# Patient Record
Sex: Female | Born: 1980 | Hispanic: Yes | Marital: Single | Smoking: Never smoker
Health system: Southern US, Community
[De-identification: ages and names within clinical notes are randomized; demographics above are authoritative.]

---

## 2014-09-15 ENCOUNTER — Ambulatory Visit (INDEPENDENT_AMBULATORY_CARE_PROVIDER_SITE_OTHER): Payer: 59 | Admitting: Family Medicine

## 2014-09-15 ENCOUNTER — Encounter: Payer: Self-pay | Admitting: Family Medicine

## 2014-09-15 ENCOUNTER — Encounter (INDEPENDENT_AMBULATORY_CARE_PROVIDER_SITE_OTHER): Payer: Self-pay

## 2014-09-15 ENCOUNTER — Ambulatory Visit (HOSPITAL_BASED_OUTPATIENT_CLINIC_OR_DEPARTMENT_OTHER)
Admission: RE | Admit: 2014-09-15 | Discharge: 2014-09-15 | Disposition: A | Discharge status: Home / Self Care | Payer: 59 | Source: Ambulatory Visit | Attending: Family Medicine | Admitting: Family Medicine

## 2014-09-15 VITALS — BP 120/76 | HR 77 | Ht 64.0 in | Wt 132.0 lb

## 2014-09-15 DIAGNOSIS — M533 Sacrococcygeal disorders, not elsewhere classified: Secondary | ICD-10-CM | POA: Insufficient documentation

## 2014-09-15 DIAGNOSIS — W000XXA Fall on same level due to ice and snow, initial encounter: Secondary | ICD-10-CM | POA: Insufficient documentation

## 2014-09-15 DIAGNOSIS — S300XXA Contusion of lower back and pelvis, initial encounter: Secondary | ICD-10-CM

## 2014-09-15 DIAGNOSIS — Y9323 Activity, snow (alpine) (downhill) skiing, snow boarding, sledding, tobogganing and snow tubing: Secondary | ICD-10-CM | POA: Diagnosis not present

## 2014-09-15 MED ORDER — HYDROCODONE-ACETAMINOPHEN 5-325 MG PO TABS: 1.0000 | ORAL_TABLET | Freq: Four times a day (QID) | ORAL | Status: AC | PRN

## 2014-09-15 NOTE — Patient Instructions (Signed)
You have a coccyx contusion. Treatment is supportive. Tylenol and/or ibuprofen as needed for pain. Icing as needed 15 minutes at a time 3-4 times a day. Consider a donut pad if sitting for a while is painful. Activities as tolerated - no restrictions. Follow up with me as needed.

## 2014-09-16 DIAGNOSIS — S300XXA Contusion of lower back and pelvis, initial encounter: Secondary | ICD-10-CM | POA: Insufficient documentation

## 2014-09-16 NOTE — Assessment & Plan Note (Signed)
radiographs negative.  Reassured patient.  Icing, tylenol/nsaids.  Donut pad if needed.  F/u prn.

## 2014-09-16 NOTE — Progress Notes (Signed)
PCP: No primary care provider on file.  Subjective:   HPI: Patient is a 34 y.o. female here for tailbone injury.  Patient reports on 2/6 while snowboarding she was coming off the lift and fell hard onto her tailbone onto ice. Pain and swelling, bruising initially. Has continued to have pain since then especially when getting up from a seated position. Took ibuprofen initially. Wants to make sure she's not damaging something.  No past medical history on file.  No current outpatient prescriptions on file prior to visit.   No current facility-administered medications on file prior to visit.    No past surgical history on file.  No Known Allergies  History   Social History  . Marital Status: Single    Spouse Name: N/A  . Number of Children: N/A  . Years of Education: N/A   Occupational History  . Not on file.   Social History Main Topics  . Smoking status: Never Smoker   . Smokeless tobacco: Not on file  . Alcohol Use: Not on file  . Drug Use: Not on file  . Sexual Activity: Not on file   Other Topics Concern  . Not on file   Social History Narrative  . No narrative on file    No family history on file.  BP 120/76 mmHg  Pulse 77  Ht 5\' 4"  (1.626 m)  Wt 132 lb (59.875 kg)  BMI 22.65 kg/m2  LMP 09/12/2014  Review of Systems: See HPI above.    Objective:  Physical Exam:  Gen: NAD  Back: No gross deformity, scoliosis. Minimal TTP low sacrum/coccyx.  No other tenderness. FROM without pain. Strength LEs 5/5 all muscle groups.   2+ MSRs in patellar and achilles tendons, equal bilaterally. Negative SLRs. Sensation intact to light touch bilaterally. Negative logroll bilateral hips    Assessment & Plan:  1. Coccyx contusion - radiographs negative.  Reassured patient.  Icing, tylenol/nsaids.  Donut pad if needed.  F/u prn.

## 2015-10-19 IMAGING — DX DG SACRUM/COCCYX 2+V
3 series · 3 of 3 positions shown · non-contrast
Comparison: None.

CLINICAL DATA: Pain following fall while snowboarding

EXAM:
SACRUM AND COCCYX - 2+ VIEW

[sacrum ap]
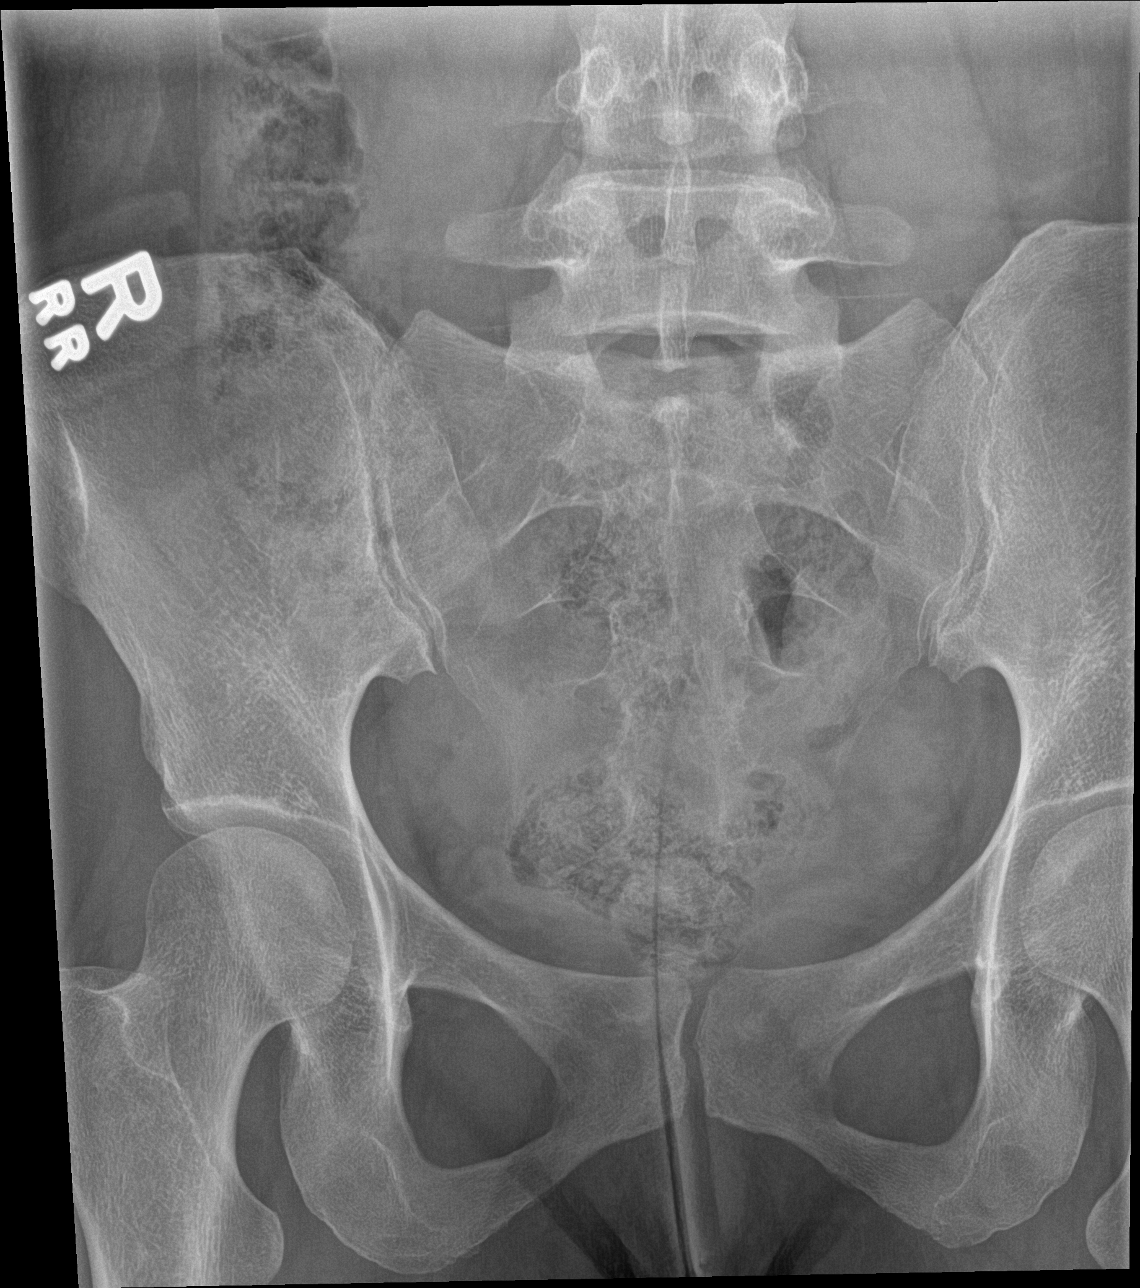

[coccyx ap]
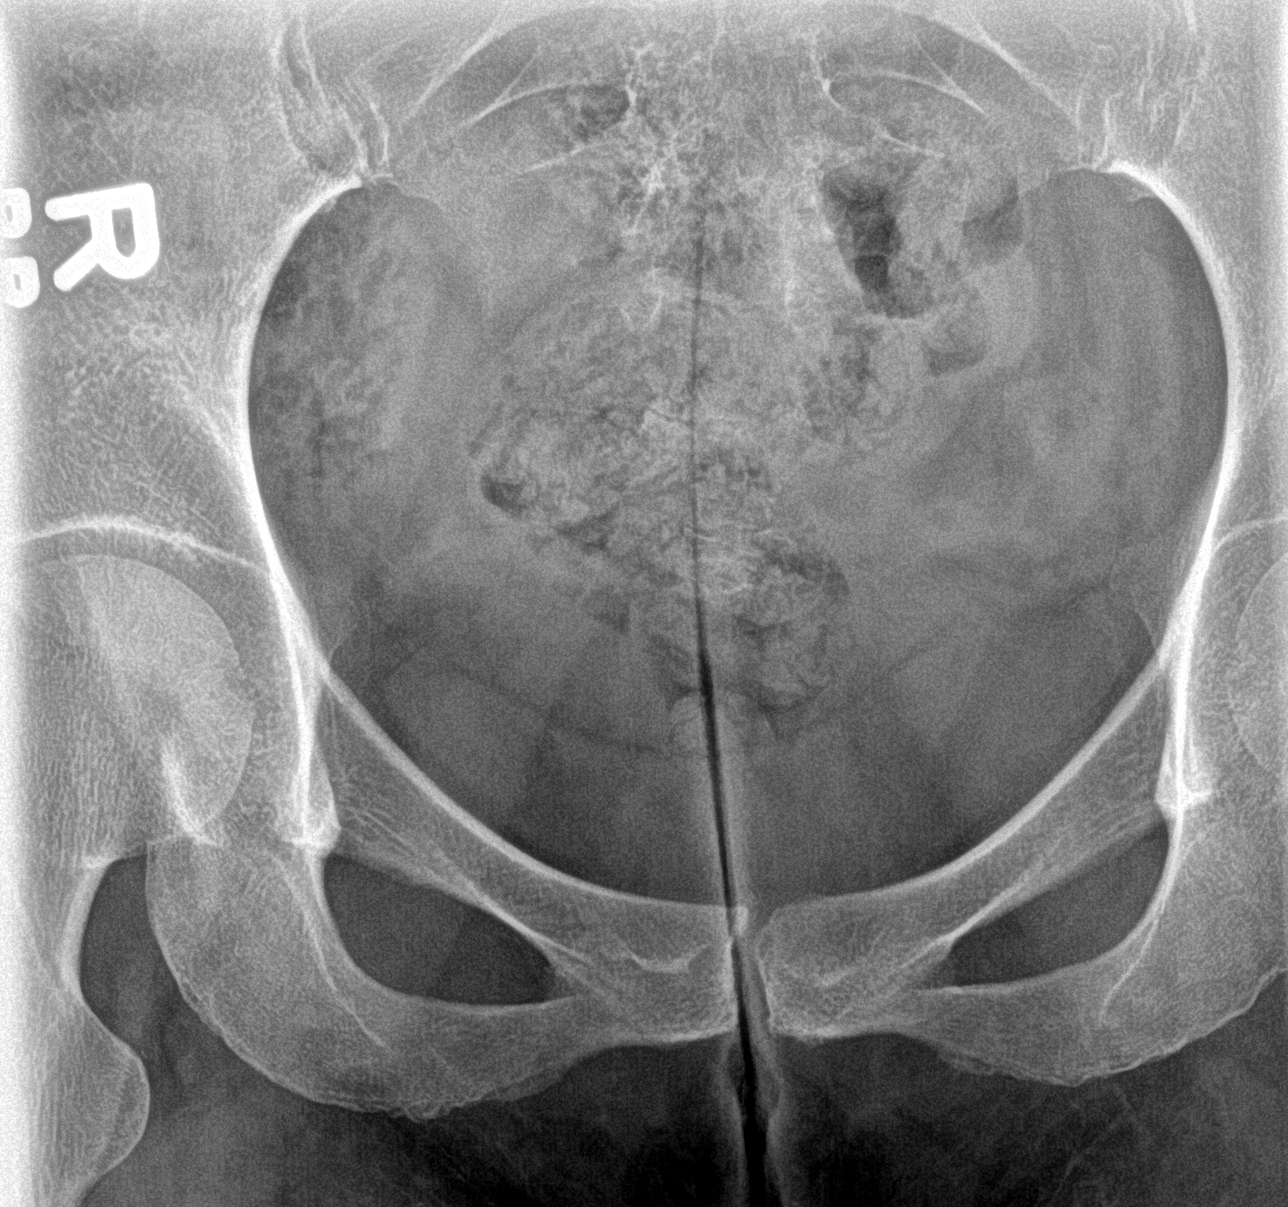

[sacrum lat]
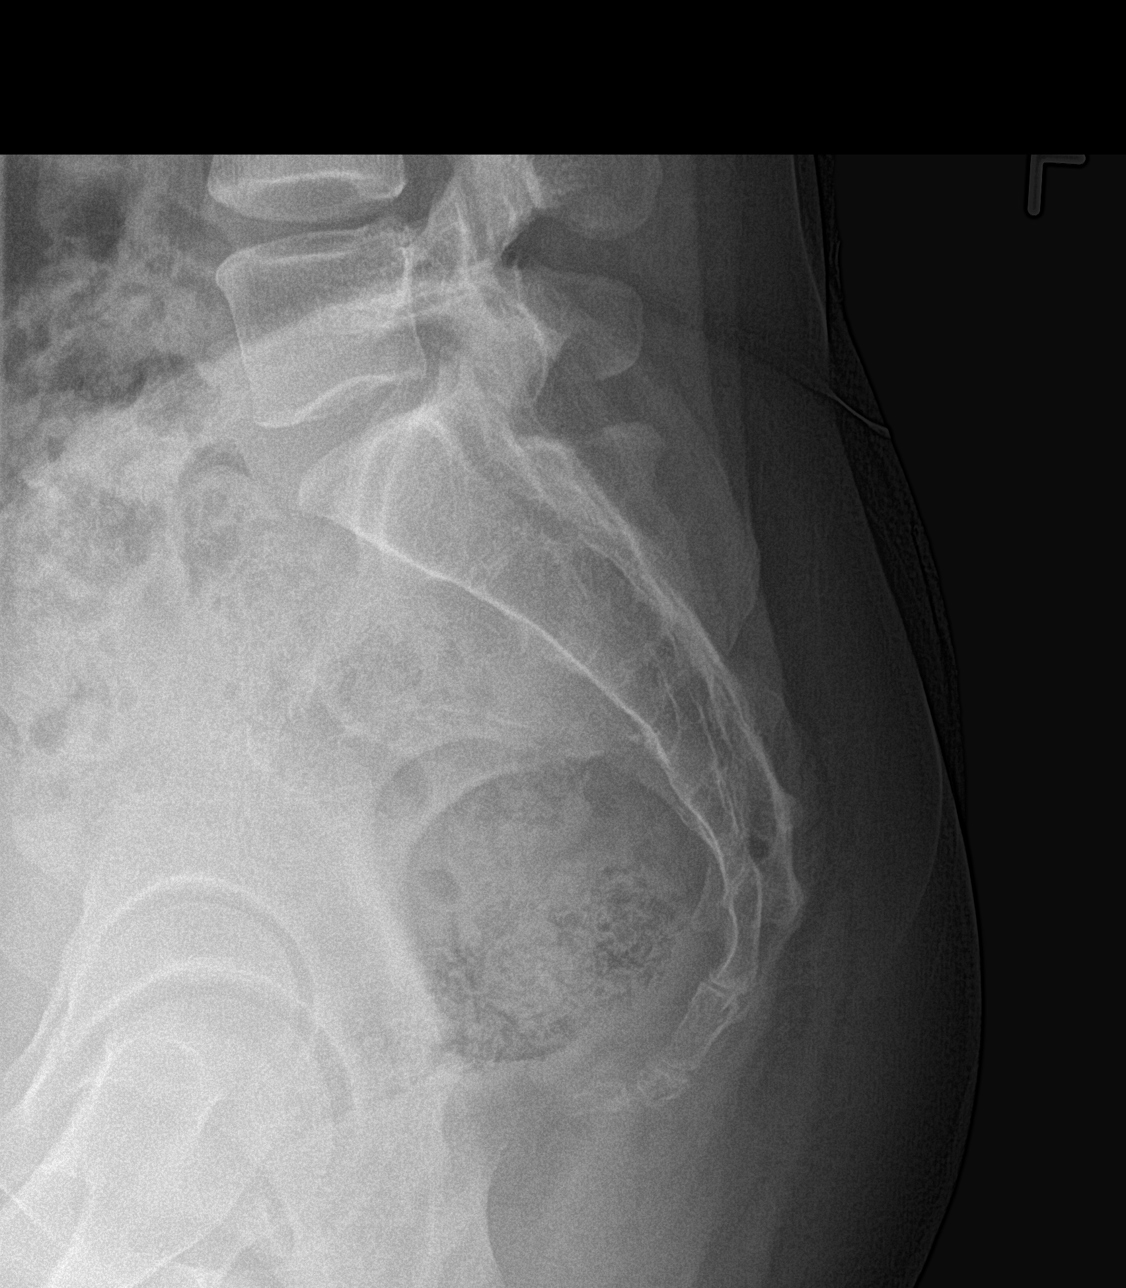

[3 of 3 positions shown; findings below may reference images not displayed]

FINDINGS: Frontal and lateral views were obtained. No fracture or diastases.
Joint spaces appear intact. No erosive change.
IMPRESSION: No fracture or diastases.  No appreciable arthropathy.
